# Patient Record
Sex: Female | Born: 1985 | Race: White | Hispanic: No | Marital: Single | Smoking: Current every day smoker
Health system: Southern US, Community
[De-identification: ages and names within clinical notes are randomized; demographics above are authoritative.]

## PROBLEM LIST (undated history)

## (undated) DIAGNOSIS — F4310 Post-traumatic stress disorder, unspecified: Secondary | ICD-10-CM

## (undated) DIAGNOSIS — F319 Bipolar disorder, unspecified: Secondary | ICD-10-CM

## (undated) DIAGNOSIS — E119 Type 2 diabetes mellitus without complications: Secondary | ICD-10-CM

## (undated) DIAGNOSIS — M5430 Sciatica, unspecified side: Secondary | ICD-10-CM

## (undated) DIAGNOSIS — M1990 Unspecified osteoarthritis, unspecified site: Secondary | ICD-10-CM

## (undated) HISTORY — PX: TONSILLECTOMY: SUR1361

## (undated) HISTORY — PX: KNEE SURGERY: SHX244

---

## 2012-01-24 ENCOUNTER — Encounter (HOSPITAL_COMMUNITY): Payer: Self-pay | Admitting: *Deleted

## 2012-01-24 ENCOUNTER — Emergency Department (HOSPITAL_COMMUNITY)
Admission: EM | Admit: 2012-01-24 | Discharge: 2012-01-24 | Disposition: A | Discharge status: Home / Self Care | Payer: Medicaid Other | Attending: Emergency Medicine | Admitting: Emergency Medicine

## 2012-01-24 DIAGNOSIS — M549 Dorsalgia, unspecified: Secondary | ICD-10-CM | POA: Insufficient documentation

## 2012-01-24 DIAGNOSIS — Z8739 Personal history of other diseases of the musculoskeletal system and connective tissue: Secondary | ICD-10-CM | POA: Insufficient documentation

## 2012-01-24 HISTORY — DX: Sciatica, unspecified side: M54.30

## 2012-01-24 HISTORY — DX: Unspecified osteoarthritis, unspecified site: M19.90

## 2012-01-24 MED ORDER — CYCLOBENZAPRINE HCL 10 MG PO TABS: 10.0000 mg | ORAL_TABLET | Freq: Two times a day (BID) | ORAL | Status: AC | PRN

## 2012-01-24 MED ORDER — OXYCODONE-ACETAMINOPHEN 5-325 MG PO TABS: 1.0000 | ORAL_TABLET | Freq: Four times a day (QID) | ORAL | Status: AC | PRN

## 2012-01-24 NOTE — ED Provider Notes (Signed)
History     CSN: [id number]  Arrival date & time 01/24/12  1458   First MD Initiated Contact with Patient 01/24/12 1643      Chief Complaint  Patient presents with  . Back Pain    (Consider location/radiation/quality/duration/timing/severity/associated sxs/prior treatment) HPI  Patient to the ED with complaints of back pain after lifting boxes while moving this morning. She has a history of having problems with sciatica in the past but it is not a problem she chronically deals with. She admits to trying Tylenol and taking a nap but her pains were still present, therefore she came to the ED. The patient denies having bowel or urinary incontinence. She is able to walk but with pain. She denies numbness and tingling to extremities. Denies IV drug use.   Past Medical History  Diagnosis Date  . Arthritis   . Sciatica     Past Surgical History  Procedure Date  . Cesarean section   . Knee surgery     No family history on file.  History  Substance Use Topics  . Smoking status: Never Smoker   . Smokeless tobacco: Not on file  . Alcohol Use: No    OB History    Grav Para Term Preterm Abortions TAB SAB Ect Mult Living                  Review of Systems   HEENT: denies blurry vision or change in hearing PULMONARY: Denies difficulty breathing and SOB CARDIAC: denies chest pain or heart palpitations MUSCULOSKELETAL:  denies being unable to ambulate ABDOMEN AL: denies abdominal pain GU: denies loss of bowel or urinary control NEURO: denies numbness and tingling in extremities   Allergies  Neosporin  Home Medications   Current Outpatient Rx  Name Route Sig Dispense Refill  . IBUPROFEN 200 MG PO TABS Oral Take 400 mg by mouth every 6 (six) hours as needed. For pain    . CYCLOBENZAPRINE HCL 10 MG PO TABS Oral Take 1 tablet (10 mg total) by mouth 2 (two) times daily as needed for muscle spasms. 20 tablet 0  . OXYCODONE-ACETAMINOPHEN 5-325 MG PO TABS Oral Take 1 tablet  by mouth every 6 (six) hours as needed for pain. 15 tablet 0    BP 146/104  Pulse 103  Temp(Src) 98.7 F (37.1 C) (Oral)  Resp 19  SpO2 100%  LMP 01/09/2012  Physical Exam  Nursing note and vitals reviewed. Constitutional: She appears well-developed and well-nourished. No distress.  HENT:  Head: Normocephalic and atraumatic.  Eyes: Pupils are equal, round, and reactive to light.  Neck: Normal range of motion. Neck supple.  Cardiovascular: Normal rate and regular rhythm.   Pulmonary/Chest: Effort normal.  Abdominal: Soft.  Musculoskeletal:       Lumbar back: She exhibits decreased range of motion, tenderness and pain. She exhibits no bony tenderness, no swelling, no edema, no deformity, no laceration, no spasm and normal pulse.       Back:        Equal strength to bilateral lower extremities. Neurosensory  function adequate to both legs. Skin color is normal. Skin is warm and moist. I see no step off deformity, no bony tenderness. Pt is able to ambulate without limp. Pain is relieved when sitting in certain positions. ROM is decreased due to pain. No crepitus, laceration, effusion, swelling.  Pulses are normal   Neurological: She is alert.  Skin: Skin is warm and dry.    ED Course  Procedures (  including critical care time)  Labs Reviewed - No data to display No results found.   1. Back pain       MDM  Patient with back pain. No neurological deficits. Patient is ambulatory. No warning symptoms of back pain including: loss of bowel or bladder control, night sweats, waking from sleep with back pain, unexplained fevers or weight loss, h/o cancer, IVDU, recent trauma. No concern for cauda equina, epidural abscess, or other serious cause of back pain. Conservative measures such as rest, ice/heat and pain medicine indicated with PCP follow-up if no improvement with conservative management.           Dorthula Matas, PA 01/24/12 1700

## 2012-01-24 NOTE — Discharge Instructions (Signed)
Back Exercises   Back exercises help treat and prevent back injuries. The goal of back exercises is to increase the strength of your abdominal and back muscles and the flexibility of your back. These exercises should be started when you no longer have back pain. Back exercises include:   Pelvic Tilt. Lie on your back with your knees bent. Tilt your pelvis until the lower part of your back is against the floor. Hold this position 5 to 10 sec and repeat 5 to 10 times.   Knee to Chest. Pull first 1 knee up against your chest and hold for 20 to 30 seconds, repeat this with the other knee, and then both knees. This may be done with the other leg straight or bent, whichever feels better.   Sit-Ups or Curl-Ups. Bend your knees 90 degrees. Start with tilting your pelvis, and do a partial, slow sit-up, lifting your trunk only 30 to 45 degrees off the floor. Take at least 2 to 3 seconds for each sit-up. Do not do sit-ups with your knees out straight. If partial sit-ups are difficult, simply do the above but with only tightening your abdominal muscles and holding it as directed.   Hip-Lift. Lie on your back with your knees flexed 90 degrees. Push down with your feet and shoulders as you raise your hips a couple inches off the floor; hold for 10 seconds, repeat 5 to 10 times.   Back arches. Lie on your stomach, propping yourself up on bent elbows. Slowly press on your hands, causing an arch in your low back. Repeat 3 to 5 times. Any initial stiffness and discomfort should lessen with repetition over time.   Shoulder-Lifts. Lie face down with arms beside your body. Keep hips and torso pressed to floor as you slowly lift your head and shoulders off the floor.   Do not overdo your exercises, especially in the beginning. Exercises may cause you some mild back discomfort which lasts for a few minutes; however, if the pain is more severe, or lasts for more than 15 minutes, do not continue exercises until you see your caregiver.  Improvement with exercise therapy for back problems is slow.   See your caregivers for assistance with developing a proper back exercise program.   Document Released: 10/02/2004 Document Revised: 08/14/2011 Document Reviewed: 08/25/2005   [employer]® Patient Information ©2012 [employer].     Back Pain, Adult   Low back pain is very common. About 1 in 5 people have back pain. The cause of low back pain is rarely dangerous. The pain often gets better over time. About half of people with a sudden onset of back pain feel better in just 2 weeks. About 8 in 10 people feel better by 6 weeks.   CAUSES   Some common causes of back pain include:   Strain of the muscles or ligaments supporting the spine.   Wear and tear (degeneration) of the spinal discs.   Arthritis.   Direct injury to the back.   DIAGNOSIS   Most of the time, the direct cause of low back pain is not known. However, back pain can be treated effectively even when the exact cause of the pain is unknown. Answering your caregiver's questions about your overall health and symptoms is one of the most accurate ways to make sure the cause of your pain is not dangerous. If your caregiver needs more information, he or she may order lab work or imaging tests (X-rays or MRIs). However, even   if imaging tests show changes in your back, this usually does not require surgery.   HOME CARE INSTRUCTIONS   For many people, back pain returns. Since low back pain is rarely dangerous, it is often a condition that people can learn to manage on their own.   Remain active. It is stressful on the back to sit or stand in one place. Do not sit, drive, or stand in one place for more than 30 minutes at a time. Take short walks on level surfaces as soon as pain allows. Try to increase the length of time you walk each day.   Do not stay in bed. Resting more than 1 or 2 days can delay your recovery.   Do not avoid exercise or work. Your body is made to move. It is not dangerous to be active,  even though your back may hurt. Your back will likely heal faster if you return to being active before your pain is gone.   Pay attention to your body when you bend and lift. Many people have less discomfort when lifting if they bend their knees, keep the load close to their bodies, and avoid twisting. Often, the most comfortable positions are those that put less stress on your recovering back.   Find a comfortable position to sleep. Use a firm mattress and lie on your side with your knees slightly bent. If you lie on your back, put a pillow under your knees.   Only take over-the-counter or prescription medicines as directed by your caregiver. Over-the-counter medicines to reduce pain and inflammation are often the most helpful. Your caregiver may prescribe muscle relaxant drugs. These medicines help dull your pain so you can more quickly return to your normal activities and healthy exercise.   Put ice on the injured area.   Put ice in a plastic bag.   Place a towel between your skin and the bag.   Leave the ice on for 15 to 20 minutes, 3 to 4 times a day for the first 2 to 3 days. After that, ice and heat may be alternated to reduce pain and spasms.   Ask your caregiver about trying back exercises and gentle massage. This may be of some benefit.   Avoid feeling anxious or stressed. Stress increases muscle tension and can worsen back pain. It is important to recognize when you are anxious or stressed and learn ways to manage it. Exercise is a great option.   SEEK MEDICAL CARE IF:   You have pain that is not relieved with rest or medicine.   You have pain that does not improve in 1 week.   You have new symptoms.   You are generally not feeling well.   SEEK IMMEDIATE MEDICAL CARE IF:   You have pain that radiates from your back into your legs.   You develop new bowel or bladder control problems.   You have unusual weakness or numbness in your arms or legs.   You develop nausea or vomiting.   You develop abdominal  pain.   You feel faint.   Document Released: 08/25/2005 Document Revised: 08/14/2011 Document Reviewed: 01/13/2011   [employer]® Patient Information ©2012 [employer].

## 2012-01-24 NOTE — ED Notes (Signed)
Pt reports moving heavy objects and reports heard a pop and noted mid and lower back pain on both sides with worse pain to right side.  Pt reports pain to right buttock.  Pt reports history of arthritis and sciatic nerve pain. Pt denies incontinence. Pt reports taking tylenol for pain with last dose this AM.  Pt reports pain is 7/10.

## 2012-01-25 NOTE — ED Provider Notes (Signed)
Medical screening examination/treatment/procedure(s) were performed by non-physician practitioner and as supervising physician I was immediately available for consultation/collaboration.  Doug Sou, MD 01/25/12 0005

## 2012-02-05 ENCOUNTER — Emergency Department (HOSPITAL_COMMUNITY)
Admission: EM | Admit: 2012-02-05 | Discharge: 2012-02-05 | Disposition: A | Discharge status: Home / Self Care | Payer: Medicaid Other | Attending: Emergency Medicine | Admitting: Emergency Medicine

## 2012-02-05 ENCOUNTER — Encounter (HOSPITAL_COMMUNITY): Payer: Self-pay

## 2012-02-05 DIAGNOSIS — M5430 Sciatica, unspecified side: Secondary | ICD-10-CM | POA: Insufficient documentation

## 2012-02-05 DIAGNOSIS — S39012A Strain of muscle, fascia and tendon of lower back, initial encounter: Secondary | ICD-10-CM

## 2012-02-05 DIAGNOSIS — M539 Dorsopathy, unspecified: Secondary | ICD-10-CM | POA: Insufficient documentation

## 2012-02-05 DIAGNOSIS — M5432 Sciatica, left side: Secondary | ICD-10-CM

## 2012-02-05 MED ORDER — NAPROXEN 500 MG PO TABS: 500.0000 mg | ORAL_TABLET | Freq: Two times a day (BID) | ORAL | Status: DC

## 2012-02-05 MED ORDER — CYCLOBENZAPRINE HCL 10 MG PO TABS: 10.0000 mg | ORAL_TABLET | Freq: Three times a day (TID) | ORAL | Status: AC | PRN

## 2012-02-05 MED ORDER — OXYCODONE-ACETAMINOPHEN 5-325 MG PO TABS: 1.0000 | ORAL_TABLET | ORAL | Status: AC | PRN

## 2012-02-05 NOTE — ED Notes (Signed)
Pt presents with low back pain after picking up her child.  Pt reports h/o arthritis in her back, reports a "pop" on L lower back with pain radiating down both legs.  Pt denies any urinary or fecal incontinence.

## 2012-02-05 NOTE — ED Provider Notes (Signed)
History  This chart was scribed for Sierra Booze, MD by Bennett Scrape. This patient was seen in room STRE7/STRE7 and the patient's care was started at 3:44PM.  CSN: [id number]  Arrival date & time 02/05/12  1422   First MD Initiated Contact with Patient 02/05/12 1544      Chief Complaint  Patient presents with  . Back Pain    The history is provided by the patient. No language interpreter was used.    Sierra Ramirez is a 26 y.o. female with a h/o arthritis and sciatica who presents to the Emergency Department complaining of low left-sided back pain described as a fiery sensation that raidates down both legs that started after picking up her child off a changing table. Movement worsens the pain. She denies taking OTC medications to improve her symptoms. Pt was seen over one week ago for same symptoms and given prescriptions which she states improved her symptoms. She also states that she is almost out of them which is why she is here. She reports that she was given a referral to an orthopedist which she has not been able to follow up with yet. She denies urinary or bowel incontinence. She denies smoking and alcohol use.  No PCP.  Past Medical History  Diagnosis Date  . Arthritis   . Sciatica     Past Surgical History  Procedure Date  . Cesarean section   . Knee surgery   . Tonsillectomy     No family history on file.  History  Substance Use Topics  . Smoking status: Never Smoker   . Smokeless tobacco: Not on file  . Alcohol Use: No     Review of Systems  Constitutional: Negative for fever and chills.  Respiratory: Negative for shortness of breath.   Gastrointestinal: Negative for nausea and vomiting.  Musculoskeletal: Positive for back pain.  Neurological: Negative for weakness.    Allergies  Neosporin  Home Medications  No current outpatient prescriptions on file.  Triage Vitals: BP 162/109  Pulse 87  Temp(Src) 98.4 F (36.9 C) (Oral)  Resp 18  Ht  5\' 4"  (1.626 m)  Wt 290 lb (131.543 kg)  BMI 49.78 kg/m2  SpO2 97%  LMP 01/09/2012  Physical Exam  Nursing note and vitals reviewed. Constitutional: She is oriented to person, place, and time. She appears well-developed and well-nourished. No distress.       Obsese, appears to be in pain  HENT:  Head: Normocephalic and atraumatic.  Eyes: EOM are normal.  Neck: Neck supple. No tracheal deviation present.  Cardiovascular: Normal rate.   Pulmonary/Chest: Effort normal. No respiratory distress.  Musculoskeletal: Normal range of motion. She exhibits tenderness.       Moderate tenderness to the lumbar spine and left para lumbar muscles with moderate to severe left para lumbar spasm, positive straight leg raise on left at 15 degrees, positive crossed straight leg raise on left at 30 degrees  Neurological: She is alert and oriented to person, place, and time.       Decreased pin prick sensation across lateral aspect of left foot  Skin: Skin is warm and dry.  Psychiatric: She has a normal mood and affect. Her behavior is normal.    ED Course  Procedures (including critical care time)  DIAGNOSTIC STUDIES: Oxygen Saturation is 97% on room air, adequate by my interpretation.    COORDINATION OF CARE: 3:50PM-Discussed discharge plan of naproxen and ice with pt and pt agreed. Advised pt to follow up with  the orthopedist referral.     1. Lumbar strain   2. Sciatica, left       MDM  Low back pain with significant component of spasm. Old records were reviewed and she does have a recent ED visit for back pain treated with oxycodone and cyclobenzaprine. She is sent home with prescriptions for naproxen, cyclobenzaprine, and oxycodone-acetaminophen. She is encouraged to make a followup appointment with the orthopedic physician she was referred to have her last visit.      I personally performed the services described in this documentation, which was scribed in my presence. The recorded  information has been reviewed and considered.      Sierra Booze, MD 02/06/12 1451

## 2012-02-05 NOTE — Discharge Instructions (Signed)
Back Pain, Adult Low back pain is very common. About 1 in 5 people have back pain.The cause of low back pain is rarely dangerous. The pain often gets better over time.About half of people with a sudden onset of back pain feel better in just 2 weeks. About 8 in 10 people feel better by 6 weeks.  CAUSES Some common causes of back pain include:  Strain of the muscles or ligaments supporting the spine.   Wear and tear (degeneration) of the spinal discs.   Arthritis.   Direct injury to the back.  DIAGNOSIS Most of the time, the direct cause of low back pain is not known.However, back pain can be treated effectively even when the exact cause of the pain is unknown.Answering your caregiver's questions about your overall health and symptoms is one of the most accurate ways to make sure the cause of your pain is not dangerous. If your caregiver needs more information, he or she may order lab work or imaging tests (X-rays or MRIs).However, even if imaging tests show changes in your back, this usually does not require surgery. HOME CARE INSTRUCTIONS For many people, back pain returns.Since low back pain is rarely dangerous, it is often a condition that people can learn to manageon their own.   Remain active. It is stressful on the back to sit or stand in one place. Do not sit, drive, or stand in one place for more than 30 minutes at a time. Take short walks on level surfaces as soon as pain allows.Try to increase the length of time you walk each day.   Do not stay in bed.Resting more than 1 or 2 days can delay your recovery.   Do not avoid exercise or work.Your body is made to move.It is not dangerous to be active, even though your back may hurt.Your back will likely heal faster if you return to being active before your pain is gone.   Pay attention to your body when you bend and lift. Many people have less discomfortwhen lifting if they bend their knees, keep the load close to their  bodies,and avoid twisting. Often, the most comfortable positions are those that put less stress on your recovering back.   Find a comfortable position to sleep. Use a firm mattress and lie on your side with your knees slightly bent. If you lie on your back, put a pillow under your knees.   Only take over-the-counter or prescription medicines as directed by your caregiver. Over-the-counter medicines to reduce pain and inflammation are often the most helpful.Your caregiver may prescribe muscle relaxant drugs.These medicines help dull your pain so you can more quickly return to your normal activities and healthy exercise.   Put ice on the injured area.   Put ice in a plastic bag.   Place a towel between your skin and the bag.   Leave the ice on for 15 to 20 minutes, 3 to 4 times a day for the first 2 to 3 days. After that, ice and heat may be alternated to reduce pain and spasms.   Ask your caregiver about trying back exercises and gentle massage. This may be of some benefit.   Avoid feeling anxious or stressed.Stress increases muscle tension and can worsen back pain.It is important to recognize when you are anxious or stressed and learn ways to manage it.Exercise is a great option.  SEEK MEDICAL CARE IF:  You have pain that is not relieved with rest or medicine.   You have   pain that does not improve in 1 week.   You have new symptoms.   You are generally not feeling well.  SEEK IMMEDIATE MEDICAL CARE IF:   You have pain that radiates from your back into your legs.   You develop new bowel or bladder control problems.   You have unusual weakness or numbness in your arms or legs.   You develop nausea or vomiting.   You develop abdominal pain.   You feel faint.  Document Released: 08/25/2005 Document Revised: 08/14/2011 Document Reviewed: 01/13/2011 [facility] Patient Information 2012 [place], [place].  Naproxen and naproxen sodium oral immediate-release tablets What is this  medicine? NAPROXEN (na PROX en) is a non-steroidal anti-inflammatory drug (NSAID). It is used to reduce swelling and to treat pain. This medicine may be used for dental pain, headache, or painful monthly periods. It is also used for painful joint and muscular problems such as arthritis, tendinitis, bursitis, and gout. This medicine may be used for other purposes; ask your health care provider or pharmacist if you have questions. What should I tell my health care provider before I take this medicine? They need to know if you have any of these conditions: -asthma -cigarette smoker -drink more than 3 alcohol containing drinks a day -heart disease or circulation problems such as heart failure or leg edema (fluid retention) -high blood pressure -kidney disease -liver disease -stomach bleeding or ulcers -an unusual or allergic reaction to naproxen, aspirin, other NSAIDs, other medicines, foods, dyes, or preservatives -pregnant or trying to get pregnant -breast-feeding How should I use this medicine? Take this medicine by mouth with a glass of water. Follow the directions on the prescription label. Take it with food if your stomach gets upset. Try to not lie down for at least 10 minutes after you take it. Take your medicine at regular intervals. Do not take your medicine more often than directed. Long-term, continuous use may increase the risk of heart attack or stroke. A special MedGuide will be given to you by the pharmacist with each prescription and refill. Be sure to read this information carefully each time. Talk to your pediatrician regarding the use of this medicine in children. Special care may be needed. Overdosage: If you think you have taken too much of this medicine contact a poison control center or emergency room at once. NOTE: This medicine is only for you. Do not share this medicine with others. What if I miss a dose? If you miss a dose, take it as soon as you can. If it is almost  time for your next dose, take only that dose. Do not take double or extra doses. What may interact with this medicine? -alcohol -aspirin -cidofovir -diuretics -lithium -methotrexate -other drugs for inflammation like ketorolac or prednisone -pemetrexed -probenecid -warfarin This list may not describe all possible interactions. Give your health care provider a list of all the medicines, herbs, non-prescription drugs, or dietary supplements you use. Also tell them if you smoke, drink alcohol, or use illegal drugs. Some items may interact with your medicine. What should I watch for while using this medicine? Tell your doctor or health care professional if your pain does not get better. Talk to your doctor before taking another medicine for pain. Do not treat yourself. This medicine does not prevent heart attack or stroke. In fact, this medicine may increase the chance of a heart attack or stroke. The chance may increase with longer use of this medicine and in people who have heart disease.  If you take aspirin to prevent heart attack or stroke, talk with your doctor or health care professional. Do not take other medicines that contain aspirin, ibuprofen, or naproxen with this medicine. Side effects such as stomach upset, nausea, or ulcers may be more likely to occur. Many medicines available without a prescription should not be taken with this medicine. This medicine can cause ulcers and bleeding in the stomach and intestines at any time during treatment. Do not smoke cigarettes or drink alcohol. These increase irritation to your stomach and can make it more susceptible to damage from this medicine. Ulcers and bleeding can happen without warning symptoms and can cause death. You may get drowsy or dizzy. Do not drive, use machinery, or do anything that needs mental alertness until you know how this medicine affects you. Do not stand or sit up quickly, especially if you are an older patient. This reduces  the risk of dizzy or fainting spells. This medicine can cause you to bleed more easily. Try to avoid damage to your teeth and gums when you brush or floss your teeth. What side effects may I notice from receiving this medicine? Side effects that you should report to your doctor or health care professional as soon as possible: -black or bloody stools, blood in the urine or vomit -blurred vision -chest pain -difficulty breathing or wheezing -nausea or vomiting -severe stomach pain -skin rash, skin redness, blistering or peeling skin, hives, or itching -slurred speech or weakness on one side of the body -swelling of eyelids, throat, lips -unexplained weight gain or swelling -unusually weak or tired -yellowing of eyes or skin Side effects that usually do not require medical attention (report to your doctor or health care professional if they continue or are bothersome): -constipation -headache -heartburn This list may not describe all possible side effects. Call your doctor for medical advice about side effects. You may report side effects to FDA at 1-800-FDA-1088. Where should I keep my medicine? Keep out of the reach of children. Store at room temperature between 15 and 30 degrees C (59 and 86 degrees F). Keep container tightly closed. Throw away any unused medicine after the expiration date. NOTE: This sheet is a summary. It may not cover all possible information. If you have questions about this medicine, talk to your doctor, pharmacist, or health care provider.  2012, Elsevier/[employer] Standard. (08/27/2009 8:10:16 PM)  Cyclobenzaprine tablets What is this medicine? CYCLOBENZAPRINE (sye kloe BEN za preen) is a muscle relaxer. It is used to treat muscle pain, spasms, and stiffness. This medicine may be used for other purposes; ask your health care provider or pharmacist if you have questions. What should I tell my health care provider before I take this medicine? They need to know if you  have any of these conditions: -heart disease, irregular heartbeat, or previous heart attack -liver disease -thyroid problem -an unusual or allergic reaction to cyclobenzaprine, tricyclic antidepressants, lactose, other medicines, foods, dyes, or preservatives -pregnant or trying to get pregnant -breast-feeding How should I use this medicine? Take this medicine by mouth with a glass of water. Follow the directions on the prescription label. If this medicine upsets your stomach, take it with food or milk. Take your medicine at regular intervals. Do not take it more often than directed. Talk to your pediatrician regarding the use of this medicine in children. Special care may be needed. Overdosage: If you think you have taken too much of this medicine contact a poison control center or emergency  room at once. NOTE: This medicine is only for you. Do not share this medicine with others. What if I miss a dose? If you miss a dose, take it as soon as you can. If it is almost time for your next dose, take only that dose. Do not take double or extra doses. What may interact with this medicine? Do not take this medicine with any of the following medications: -cisapride -droperidol -flecainide -grepafloxacin -halofantrine -levomethadyl -MAOIs like Carbex, Eldepryl, Marplan, Nardil, and Parnate -nilotinib -pimozide -probucol -sertindole This medicine may also interact with the following medications: -abarelix -alcohol -contrast dyes -dolasetron -guanethidine -medicines for cancer -medicines for depression, anxiety, or psychotic disturbances -medicines to treat an irregular heartbeat -medicines used for sleep or numbness during surgery or procedure -methadone -octreotide -ondansetron -palonosetron -phenothiazines like chlorpromazine, mesoridazine, prochlorperazine, thioridazine -some medicines for infection like alfuzosin, chloroquine, clarithromycin, levofloxacin, mefloquine,  pentamidine, troleandomycin -tramadol -vardenafil This list may not describe all possible interactions. Give your health care provider a list of all the medicines, herbs, non-prescription drugs, or dietary supplements you use. Also tell them if you smoke, drink alcohol, or use illegal drugs. Some items may interact with your medicine. What should I watch for while using this medicine? Check with your doctor or health care professional if your condition does not improve within 1 to 3 weeks. You may get drowsy or dizzy when you first start taking the medicine or change doses. Do not drive, use machinery, or do anything that may be dangerous until you know how the medicine affects you. Stand or sit up slowly. Your mouth may get dry. Drinking water, chewing sugarless gum, or sucking on hard candy may help. What side effects may I notice from receiving this medicine? Side effects that you should report to your doctor or health care professional as soon as possible: -allergic reactions like skin rash, itching or hives, swelling of the face, lips, or tongue -chest pain -fast heartbeat -hallucinations -seizures -vomiting Side effects that usually do not require medical attention (report to your doctor or health care professional if they continue or are bothersome): -headache This list may not describe all possible side effects. Call your doctor for medical advice about side effects. You may report side effects to FDA at 1-800-FDA-1088. Where should I keep my medicine? Keep out of the reach of children. Store at room temperature between 15 and 30 degrees C (59 and 86 degrees F). Keep container tightly closed. Throw away any unused medicine after the expiration date. NOTE: This sheet is a summary. It may not cover all possible information. If you have questions about this medicine, talk to your doctor, pharmacist, or health care provider.  2012, [employer]vier/Gold Standard. (12/06/2007 10:26:21  PM)  Acetaminophen; Oxycodone tablets What is this medicine? ACETAMINOPHEN; OXYCODONE (a set a MEE noe fen; ox i KOE done) is a pain reliever. It is used to treat mild to moderate pain. This medicine may be used for other purposes; ask your health care provider or pharmacist if you have questions. What should I tell my health care provider before I take this medicine? They need to know if you have any of these conditions: -brain tumor -Crohn's disease, inflammatory bowel disease, or ulcerative colitis -drink more than 3 alcohol containing drinks per day -drug abuse or addiction -head injury -heart or circulation problems -kidney disease or problems going to the bathroom -liver disease -lung disease, asthma, or breathing problems -an unusual or allergic reaction to acetaminophen, oxycodone, other opioid analgesics, other medicines,  foods, dyes, or preservatives -pregnant or trying to get pregnant -breast-feeding How should I use this medicine? Take this medicine by mouth with a full glass of water. Follow the directions on the prescription label. Take your medicine at regular intervals. Do not take your medicine more often than directed. Talk to your pediatrician regarding the use of this medicine in children. Special care may be needed. Patients over 34 years old may have a stronger reaction and need a smaller dose. Overdosage: If you think you have taken too much of this medicine contact a poison control center or emergency room at once. NOTE: This medicine is only for you. Do not share this medicine with others. What if I miss a dose? If you miss a dose, take it as soon as you can. If it is almost time for your next dose, take only that dose. Do not take double or extra doses. What may interact with this medicine? -alcohol or medicines that contain alcohol -antihistamines -barbiturates like amobarbital, butalbital, butabarbital, methohexital, pentobarbital, phenobarbital, thiopental,  and secobarbital -benztropine -drugs for bladder problems like solifenacin, trospium, oxybutynin, tolterodine, hyoscyamine, and methscopolamine -drugs for breathing problems like ipratropium and tiotropium -drugs for certain stomach or intestine problems like propantheline, homatropine methylbromide, glycopyrrolate, atropine, belladonna, and dicyclomine -general anesthetics like etomidate, ketamine, nitrous oxide, propofol, desflurane, enflurane, halothane, isoflurane, and sevoflurane -medicines for depression, anxiety, or psychotic disturbances -medicines for pain like codeine, morphine, pentazocine, buprenorphine, butorphanol, nalbuphine, tramadol, and propoxyphene -medicines for sleep -muscle relaxants -naltrexone -phenothiazines like perphenazine, thioridazine, chlorpromazine, mesoridazine, fluphenazine, prochlorperazine, promazine, and trifluoperazine -scopolamine -trihexyphenidyl This list may not describe all possible interactions. Give your health care provider a list of all the medicines, herbs, non-prescription drugs, or dietary supplements you use. Also tell them if you smoke, drink alcohol, or use illegal drugs. Some items may interact with your medicine. What should I watch for while using this medicine? Tell your doctor or health care professional if your pain does not go away, if it gets worse, or if you have new or a different type of pain. You may develop tolerance to the medicine. Tolerance means that you will need a higher dose of the medication for pain relief. Tolerance is normal and is expected if you take this medicine for a long time. Do not suddenly stop taking your medicine because you may develop a severe reaction. Your body becomes used to the medicine. This does NOT mean you are addicted. Addiction is a behavior related to getting and using a drug for a nonmedical reason. If you have pain, you have a medical reason to take pain medicine. Your doctor will tell you how  much medicine to take. If your doctor wants you to stop the medicine, the dose will be slowly lowered over time to avoid any side effects. You may get drowsy or dizzy. Do not drive, use machinery, or do anything that needs mental alertness until you know how this medicine affects you. Do not stand or sit up quickly, especially if you are an older patient. This reduces the risk of dizzy or fainting spells. Alcohol may interfere with the effect of this medicine. Avoid alcoholic drinks. The medicine will cause constipation. Try to have a bowel movement at least every 2 to 3 days. If you do not have a bowel movement for 3 days, call your doctor or health care professional. Do not take Tylenol (acetaminophen) or medicines that have acetaminophen with this medicine. Too much acetaminophen can be very dangerous. Many nonprescription  medicines contain acetaminophen. Always read the labels carefully to avoid taking more acetaminophen. What side effects may I notice from receiving this medicine? Side effects that you should report to your doctor or health care professional as soon as possible: -allergic reactions like skin rash, itching or hives, swelling of the face, lips, or tongue -breathing difficulties, wheezing -confusion -light headedness or fainting spells -severe stomach pain -yellowing of the skin or the whites of the eyes Side effects that usually do not require medical attention (report to your doctor or health care professional if they continue or are bothersome): -dizziness -drowsiness -nausea -vomiting This list may not describe all possible side effects. Call your doctor for medical advice about side effects. You may report side effects to FDA at 1-800-FDA-1088. Where should I keep my medicine? Keep out of the reach of children. This medicine can be abused. Keep your medicine in a safe place to protect it from theft. Do not share this medicine with anyone. Selling or giving away this  medicine is dangerous and against the law. Store at room temperature between 20 and 25 degrees C (68 and 77 degrees F). Keep container tightly closed. Protect from light. Flush any unused medicines down the toilet. Do not use the medicine after the expiration date. NOTE: This sheet is a summary. It may not cover all possible information. If you have questions about this medicine, talk to your doctor, pharmacist, or health care provider.  2012, Elsevier/Gold Standard. (07/24/2008 10:01:21 AM)

## 2012-04-09 ENCOUNTER — Encounter (HOSPITAL_COMMUNITY): Payer: Self-pay | Admitting: *Deleted

## 2012-04-09 ENCOUNTER — Emergency Department (HOSPITAL_COMMUNITY)
Admission: EM | Admit: 2012-04-09 | Discharge: 2012-04-09 | Disposition: A | Discharge status: Home / Self Care | Payer: Medicaid Other | Attending: Emergency Medicine | Admitting: Emergency Medicine

## 2012-04-09 DIAGNOSIS — IMO0002 Reserved for concepts with insufficient information to code with codable children: Secondary | ICD-10-CM | POA: Insufficient documentation

## 2012-04-09 DIAGNOSIS — M129 Arthropathy, unspecified: Secondary | ICD-10-CM | POA: Insufficient documentation

## 2012-04-09 DIAGNOSIS — M5416 Radiculopathy, lumbar region: Secondary | ICD-10-CM

## 2012-04-09 MED ORDER — HYDROCODONE-ACETAMINOPHEN 5-325 MG PO TABS: 2.0000 | ORAL_TABLET | ORAL | Status: AC | PRN

## 2012-04-09 NOTE — ED Provider Notes (Signed)
History     CSN: [id number]  Arrival date & time 04/09/12  1411   None     Chief Complaint  Patient presents with  . Back Pain    (Consider location/radiation/quality/duration/timing/severity/associated sxs/prior treatment) HPI Complaint of low back pain radiating to both thighs onset 12 noon today suddenly when she flexes at the waist to pick something up at home pain was mild at first, has become worse over the past few hours. Treated with Tylenol and ibuprofen without relief. No other complaints no loss of bladder or bowel control  no other complaint. No other associated symptoms pain is improved with one on her left side worse with bending at the waist.Past Medical History  Diagnosis Date  . Arthritis   . Sciatica     Past Surgical History  Procedure Date  . Cesarean section   . Knee surgery   . Tonsillectomy     No family history on file.  History  Substance Use Topics  . Smoking status: Never Smoker   . Smokeless tobacco: Not on file  . Alcohol Use: No    OB History    Grav Para Term Preterm Abortions TAB SAB Ect Mult Living                  Review of Systems  Constitutional: Negative.   HENT: Negative.   Respiratory: Negative.   Cardiovascular: Negative.   Gastrointestinal: Negative.   Musculoskeletal: Positive for back pain.  Skin: Negative.   Neurological: Negative.   Hematological: Negative.   Psychiatric/Behavioral: Negative.   All other systems reviewed and are negative.    Allergies  Neosporin  Home Medications  No current outpatient prescriptions on file.  BP 149/93  Pulse 101  Temp 98.8 F (37.1 C) (Oral)  Resp 18  Ht 5\' 3"  (1.6 m)  Wt 277 lb (125.646 kg)  BMI 49.07 kg/m2  SpO2 98%  LMP 04/09/2012  Physical Exam  Nursing note and vitals reviewed. Constitutional: She appears well-developed and well-nourished.  HENT:  Head: Normocephalic and atraumatic.  Eyes: Conjunctivae are normal. Pupils are equal, round, and reactive to  light.  Neck: Neck supple. No tracheal deviation present. No thyromegaly present.  Cardiovascular: Normal rate and regular rhythm.   No murmur heard. Pulmonary/Chest: Effort normal and breath sounds normal.  Abdominal: Soft. Bowel sounds are normal. She exhibits no distension. There is no tenderness.       Morbidly obese  Musculoskeletal: Normal range of motion. She exhibits no edema and no tenderness.       Entire spine nontender  Neurological: She is alert. She has normal reflexes. Coordination normal.       Gait normal  Skin: Skin is warm and dry. No rash noted.  Psychiatric: She has a normal mood and affect. Her behavior is normal.    ED Course  Procedures (including critical care time)  Labs Reviewed - No data to display No results found.   No diagnosis found.    MDM  Plan prescription Norco. Patient driving home referral resource guide to get a primary care doctor Diagnosis lumbar radiculopathy      Plus  Doug Sou, MD 04/09/12 [phone]

## 2012-04-09 NOTE — ED Notes (Signed)
Pt state she started having back pain today at 12pm. Pt states she was cleaning and hear a pop in her back. Pt states she is lower back pain.

## 2012-07-22 ENCOUNTER — Emergency Department (HOSPITAL_COMMUNITY)
Admission: EM | Admit: 2012-07-22 | Discharge: 2012-07-22 | Disposition: A | Discharge status: Home / Self Care | Payer: Medicaid Other | Attending: Emergency Medicine | Admitting: Emergency Medicine

## 2012-07-22 ENCOUNTER — Emergency Department (HOSPITAL_COMMUNITY): Payer: Medicaid Other

## 2012-07-22 ENCOUNTER — Encounter (HOSPITAL_COMMUNITY): Payer: Self-pay | Admitting: Emergency Medicine

## 2012-07-22 DIAGNOSIS — R509 Fever, unspecified: Secondary | ICD-10-CM | POA: Insufficient documentation

## 2012-07-22 DIAGNOSIS — J40 Bronchitis, not specified as acute or chronic: Secondary | ICD-10-CM | POA: Insufficient documentation

## 2012-07-22 DIAGNOSIS — R51 Headache: Secondary | ICD-10-CM | POA: Insufficient documentation

## 2012-07-22 DIAGNOSIS — Z8739 Personal history of other diseases of the musculoskeletal system and connective tissue: Secondary | ICD-10-CM | POA: Insufficient documentation

## 2012-07-22 DIAGNOSIS — R0602 Shortness of breath: Secondary | ICD-10-CM | POA: Insufficient documentation

## 2012-07-22 LAB — D-DIMER, QUANTITATIVE: D-Dimer, Quant: <0.27 ug/mL-FEU (ref 0.00–0.48)

## 2012-07-22 LAB — CBC WITH DIFFERENTIAL/PLATELET
Basophils Absolute: 0.1 10*3/uL (ref 0.0–0.1)
Basophils Relative: 0 % (ref 0–1)
Eosinophils Absolute: 0.5 10*3/uL (ref 0.0–0.7)
HCT: 34 % — ABNORMAL LOW (ref 36.0–46.0)
Hemoglobin: 11.7 g/dL — ABNORMAL LOW (ref 12.0–15.0)
MCH: 30.2 pg (ref 26.0–34.0)
MCHC: 34.4 g/dL (ref 30.0–36.0)
Monocytes Absolute: 1.5 10*3/uL — ABNORMAL HIGH (ref 0.1–1.0)
Monocytes Relative: 9 % (ref 3–12)
Neutro Abs: 9.7 10*3/uL — ABNORMAL HIGH (ref 1.7–7.7)
Neutrophils Relative %: 60 % (ref 43–77)
RDW: 12.6 % (ref 11.5–15.5)

## 2012-07-22 LAB — POCT I-STAT, CHEM 8
Calcium, Ion: 1.21 mmol/L (ref 1.12–1.23)
Chloride: 109 mEq/L (ref 96–112)
Glucose, Bld: 102 mg/dL — ABNORMAL HIGH (ref 70–99)
HCT: 32 % — ABNORMAL LOW (ref 36.0–46.0)
Hemoglobin: 10.9 g/dL — ABNORMAL LOW (ref 12.0–15.0)

## 2012-07-22 MED ORDER — ALBUTEROL SULFATE HFA 108 (90 BASE) MCG/ACT IN AERS: 1.0000 | INHALATION_SPRAY | Freq: Four times a day (QID) | RESPIRATORY_TRACT | Status: AC | PRN

## 2012-07-22 MED ORDER — ACETAMINOPHEN-CODEINE #3 300-30 MG PO TABS: 1.0000 | ORAL_TABLET | Freq: Four times a day (QID) | ORAL | Status: DC | PRN

## 2012-07-22 MED ORDER — HYDROCODONE-ACETAMINOPHEN 5-325 MG PO TABS: 1.0000 | ORAL_TABLET | Freq: Four times a day (QID) | ORAL | Status: DC | PRN

## 2012-07-22 MED ORDER — BENZONATATE 100 MG PO CAPS
100.0000 mg | ORAL_CAPSULE | Freq: Once | ORAL | Status: AC
  Administered 2012-07-22: 100 mg via ORAL
  Filled 2012-07-22: qty 1

## 2012-07-22 MED ORDER — KETOROLAC TROMETHAMINE 30 MG/ML IJ SOLN
30.0000 mg | Freq: Once | INTRAMUSCULAR | Status: AC
Start: 2012-07-22 — End: 2012-07-22
  Administered 2012-07-22: 30 mg via INTRAVENOUS
  Filled 2012-07-22: qty 1

## 2012-07-22 MED ORDER — SODIUM CHLORIDE 0.9 % IV BOLUS (SEPSIS)
1000.0000 mL | Freq: Once | INTRAVENOUS | Status: AC
  Administered 2012-07-22: 1000 mL via INTRAVENOUS

## 2012-07-22 MED ORDER — ALBUTEROL SULFATE HFA 108 (90 BASE) MCG/ACT IN AERS: 1.0000 | INHALATION_SPRAY | Freq: Four times a day (QID) | RESPIRATORY_TRACT | Status: DC | PRN

## 2012-07-22 NOTE — ED Provider Notes (Signed)
Patient remains tachycardic despite two liters of fluid.  Temp < 100.  Lungs CTA bilaterally, persistent cough.  CXR without indication of pneumonia--mild bronchitic changes noted.  Discussed with Dr. Mick Sell check d-dimer to evaluate for pulmonary embolism in light of exertional dyspnea with persistent tachycardia.  11:22 PM D-dimer negative.  Will discharge home with prescriptions for vicodin and albuterol MDI.  Patient to follow-up with her PCP in [place].  Jimmye Norman, NP 07/22/12 [phone]

## 2012-07-22 NOTE — ED Provider Notes (Signed)
History  Scribed for Sierra Munch, MD, the patient was seen in room TR04C/TR04C. This chart was scribed by Sierra Ramirez. The patient's care started at 6:21 PM   CSN: [id number]  Arrival date & time 07/22/12  1756   First MD Initiated Contact with Patient 07/22/12 1817      Chief Complaint  Patient presents with  . Cough     The history is provided by the patient. No language interpreter was used.   Sierra Ramirez is a 26 y.o. female who presents to the Emergency Department complaining of cough and SOB that started six days ago.  Pt is also experiencing a headache that is worse with coughing.  She has had a fever and body aches.  Pt has used Vicks with little relief.  She denies any ill contacts.    Past Medical History  Diagnosis Date  . Arthritis   . Sciatica     Past Surgical History  Procedure Date  . Cesarean section   . Knee surgery   . Tonsillectomy     History reviewed. No pertinent family history.  History  Substance Use Topics  . Smoking status: Never Smoker   . Smokeless tobacco: Not on file  . Alcohol Use: No    OB History    Grav Para Term Preterm Abortions TAB SAB Ect Mult Living                  Review of Systems  Constitutional: Positive for fever.       Per HPI, otherwise negative  HENT:       Per HPI, otherwise negative  Eyes: Negative.   Respiratory: Positive for cough and shortness of breath.        Per HPI, otherwise negative  Cardiovascular:       Per HPI, otherwise negative  Gastrointestinal: Negative for nausea and vomiting.  Genitourinary: Negative.   Musculoskeletal:       Per HPI, otherwise negative  Skin: Negative.   Neurological: Positive for headaches. Negative for syncope.  All other systems reviewed and are negative.    Allergies  Neosporin  Home Medications  No current outpatient prescriptions on file.  BP 151/111  Pulse 118  Temp 99 F (37.2 C) (Oral)  Resp 18  SpO2 97%  LMP  07/02/2012  Physical Exam  Nursing note and vitals reviewed. Constitutional: She is oriented to person, place, and time. She appears well-developed and well-nourished. No distress.  HENT:  Head: Normocephalic and atraumatic.  Mouth/Throat: Oropharynx is clear and moist.  Eyes: EOM are normal.  Neck: Neck supple. No tracheal deviation present.  Cardiovascular:       Tachycardic   Pulmonary/Chest: Effort normal. No respiratory distress. She has no wheezes. She has no rales.  Musculoskeletal: Normal range of motion.  Neurological: She is alert and oriented to person, place, and time.  Skin: Skin is warm and dry.  Psychiatric: She has a normal mood and affect. Her behavior is normal.    ED Course  Procedures  DIAGNOSTIC STUDIES: Oxygen Saturation is 97% on room air, normal by my interpretation.    COORDINATION OF CARE:  18:27 Ordered: DG Chest 2 View   Labs Reviewed - No data to display No results found.   No diagnosis found.    MDM  I personally performed the services described in this documentation, which was scribed in my presence. The recorded information has been reviewed and is accurate.  This previously well female presents with new  cough, generalized discomfort.  On exam she is tachycardic, uncomfortable appearing.  Patient's oropharynx is clear.  She is in no distress.  Given the tachycardia, or some suspicion for acute viral upper respiratory infection.  Absent risk factors she is not candidate for a period influenza treatment.  Given her vital sign abnormalities, she received IV fluids, analgesics.  She was transferred to the CDU for the completion of her care.    Sierra Munch, MD 07/22/12 [phone]

## 2012-07-22 NOTE — ED Notes (Signed)
Pt wanting to know why she needs more blood drawn.  Explained to pt that we need to make sure she doesn't have a blood clot and if that test is negative she'll go home.

## 2012-07-22 NOTE — ED Notes (Signed)
Lab in with pt 

## 2012-07-22 NOTE — ED Notes (Signed)
Patient currently in xray. Family at bedside. Report received by Sedonia Small, RN.

## 2012-07-22 NOTE — ED Notes (Signed)
Pt reports cough since Friday, reports having coughing spells, feeling fatigue, denies sore throat

## 2012-07-24 NOTE — ED Provider Notes (Signed)
Medical screening examination/treatment/procedure(s) were performed by non-physician practitioner and as supervising physician I was immediately available for consultation/collaboration.  John-Adam Nadene Witherspoon, M.D.     John-Adam Jontae Adebayo, MD 07/24/12 1416 

## 2014-09-21 ENCOUNTER — Emergency Department (HOSPITAL_COMMUNITY)
Admission: EM | Admit: 2014-09-21 | Discharge: 2014-09-22 | Disposition: A | Discharge status: Home / Self Care | Payer: Medicaid Other | Attending: Emergency Medicine | Admitting: Emergency Medicine

## 2014-09-21 ENCOUNTER — Encounter (HOSPITAL_COMMUNITY): Payer: Self-pay | Admitting: Nurse Practitioner

## 2014-09-21 DIAGNOSIS — Z3202 Encounter for pregnancy test, result negative: Secondary | ICD-10-CM | POA: Insufficient documentation

## 2014-09-21 DIAGNOSIS — F4310 Post-traumatic stress disorder, unspecified: Secondary | ICD-10-CM | POA: Diagnosis not present

## 2014-09-21 DIAGNOSIS — R51 Headache: Secondary | ICD-10-CM | POA: Diagnosis not present

## 2014-09-21 DIAGNOSIS — F3130 Bipolar disorder, current episode depressed, mild or moderate severity, unspecified: Secondary | ICD-10-CM | POA: Insufficient documentation

## 2014-09-21 DIAGNOSIS — M545 Low back pain: Secondary | ICD-10-CM | POA: Diagnosis not present

## 2014-09-21 DIAGNOSIS — Z79899 Other long term (current) drug therapy: Secondary | ICD-10-CM | POA: Diagnosis not present

## 2014-09-21 DIAGNOSIS — R1084 Generalized abdominal pain: Secondary | ICD-10-CM | POA: Insufficient documentation

## 2014-09-21 DIAGNOSIS — R1030 Lower abdominal pain, unspecified: Secondary | ICD-10-CM | POA: Diagnosis present

## 2014-09-21 DIAGNOSIS — R519 Headache, unspecified: Secondary | ICD-10-CM

## 2014-09-21 DIAGNOSIS — M542 Cervicalgia: Secondary | ICD-10-CM | POA: Diagnosis not present

## 2014-09-21 DIAGNOSIS — E119 Type 2 diabetes mellitus without complications: Secondary | ICD-10-CM | POA: Insufficient documentation

## 2014-09-21 DIAGNOSIS — M549 Dorsalgia, unspecified: Secondary | ICD-10-CM

## 2014-09-21 HISTORY — DX: Post-traumatic stress disorder, unspecified: F43.10

## 2014-09-21 HISTORY — DX: Bipolar disorder, unspecified: F31.9

## 2014-09-21 HISTORY — DX: Type 2 diabetes mellitus without complications: E11.9

## 2014-09-21 LAB — POC URINE PREG, ED: PREG TEST UR: NEGATIVE

## 2014-09-21 MED ORDER — ALBUTEROL SULFATE (2.5 MG/3ML) 0.083% IN NEBU: 5.0000 mg | INHALATION_SOLUTION | Freq: Once | RESPIRATORY_TRACT | Status: DC

## 2014-09-21 MED ORDER — IPRATROPIUM BROMIDE 0.02 % IN SOLN: 0.5000 mg | Freq: Once | RESPIRATORY_TRACT | Status: DC

## 2014-09-21 MED ORDER — DIPHENHYDRAMINE HCL 50 MG/ML IJ SOLN
25.0000 mg | Freq: Once | INTRAMUSCULAR | Status: DC
  Filled 2014-09-21: qty 1

## 2014-09-21 MED ORDER — KETOROLAC TROMETHAMINE 30 MG/ML IJ SOLN
30.0000 mg | Freq: Once | INTRAMUSCULAR | Status: DC
Start: 2014-09-21 — End: 2014-09-22
  Filled 2014-09-21: qty 1

## 2014-09-21 MED ORDER — METOCLOPRAMIDE HCL 5 MG/ML IJ SOLN
10.0000 mg | Freq: Once | INTRAMUSCULAR | Status: DC
  Filled 2014-09-21: qty 2
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
## 2014-09-21 NOTE — ED Notes (Signed)
x2 unsuccessful IV attempts

## 2014-09-22 LAB — URINALYSIS, ROUTINE W REFLEX MICROSCOPIC
BILIRUBIN URINE: NEGATIVE
GLUCOSE, UA: NEGATIVE mg/dL
KETONES UR: 15 mg/dL — AB
LEUKOCYTES UA: NEGATIVE
Nitrite: NEGATIVE
Protein, ur: NEGATIVE mg/dL
SPECIFIC GRAVITY, URINE: 1.01 (ref 1.005–1.030)
Urobilinogen, UA: 0.2 mg/dL (ref 0.0–1.0)
pH: 6 (ref 5.0–8.0)

## 2014-09-22 LAB — URINE MICROSCOPIC-ADD ON

## 2014-09-22 MED ORDER — OXYCODONE-ACETAMINOPHEN 5-325 MG PO TABS
2.0000 | ORAL_TABLET | Freq: Once | ORAL | Status: AC
  Administered 2014-09-22: 2 via ORAL
  Filled 2014-09-22: qty 2

## 2014-09-22 MED ORDER — HYDROCODONE-ACETAMINOPHEN 5-325 MG PO TABS: 1.0000 | ORAL_TABLET | ORAL | Status: AC | PRN

## 2014-09-22 MED ORDER — NAPROXEN 500 MG PO TABS: 500.0000 mg | ORAL_TABLET | Freq: Two times a day (BID) | ORAL | Status: AC

## 2014-09-22 MED ORDER — KETOROLAC TROMETHAMINE 30 MG/ML IJ SOLN
30.0000 mg | Freq: Once | INTRAMUSCULAR | Status: AC
  Administered 2014-09-22: 30 mg via INTRAMUSCULAR
  Filled 2014-09-22: qty 1

## 2014-09-22 MED ORDER — DIPHENHYDRAMINE HCL 25 MG PO CAPS
25.0000 mg | ORAL_CAPSULE | Freq: Once | ORAL | Status: AC
  Administered 2014-09-22: 25 mg via ORAL
  Filled 2014-09-22: qty 1

## 2018-02-04 ENCOUNTER — Emergency Department (HOSPITAL_COMMUNITY)
Admission: EM | Admit: 2018-02-04 | Discharge: 2018-02-04 | Disposition: A | Discharge status: Home / Self Care | Payer: No Typology Code available for payment source | Attending: Emergency Medicine | Admitting: Emergency Medicine

## 2018-02-04 ENCOUNTER — Emergency Department (HOSPITAL_COMMUNITY): Payer: No Typology Code available for payment source

## 2018-02-04 ENCOUNTER — Other Ambulatory Visit: Payer: Self-pay

## 2018-02-04 ENCOUNTER — Encounter (HOSPITAL_COMMUNITY): Payer: Self-pay

## 2018-02-04 DIAGNOSIS — E119 Type 2 diabetes mellitus without complications: Secondary | ICD-10-CM | POA: Diagnosis not present

## 2018-02-04 DIAGNOSIS — Y939 Activity, unspecified: Secondary | ICD-10-CM | POA: Diagnosis not present

## 2018-02-04 DIAGNOSIS — Y929 Unspecified place or not applicable: Secondary | ICD-10-CM | POA: Insufficient documentation

## 2018-02-04 DIAGNOSIS — S62650A Nondisplaced fracture of medial phalanx of right index finger, initial encounter for closed fracture: Secondary | ICD-10-CM | POA: Insufficient documentation

## 2018-02-04 DIAGNOSIS — Y999 Unspecified external cause status: Secondary | ICD-10-CM | POA: Insufficient documentation

## 2018-02-04 DIAGNOSIS — M7918 Myalgia, other site: Secondary | ICD-10-CM

## 2018-02-04 DIAGNOSIS — R51 Headache: Secondary | ICD-10-CM | POA: Insufficient documentation

## 2018-02-04 DIAGNOSIS — Z79899 Other long term (current) drug therapy: Secondary | ICD-10-CM | POA: Diagnosis not present

## 2018-02-04 DIAGNOSIS — M542 Cervicalgia: Secondary | ICD-10-CM | POA: Diagnosis not present

## 2018-02-04 DIAGNOSIS — F17210 Nicotine dependence, cigarettes, uncomplicated: Secondary | ICD-10-CM | POA: Insufficient documentation

## 2018-02-04 MED ORDER — TRAMADOL HCL 50 MG PO TABS: 50.0000 mg | ORAL_TABLET | Freq: Four times a day (QID) | ORAL | 0 refills | Status: AC | PRN

## 2018-02-04 MED ORDER — KETOROLAC TROMETHAMINE 30 MG/ML IJ SOLN
30.0000 mg | Freq: Once | INTRAMUSCULAR | Status: AC
  Administered 2018-02-04: 30 mg via INTRAMUSCULAR
  Filled 2018-02-04: qty 1

## 2018-02-04 MED ORDER — DIPHENHYDRAMINE HCL 25 MG PO CAPS
25.0000 mg | ORAL_CAPSULE | Freq: Once | ORAL | Status: AC
  Administered 2018-02-04: 25 mg via ORAL
  Filled 2018-02-04: qty 1

## 2018-02-04 MED ORDER — METOCLOPRAMIDE HCL 10 MG PO TABS
5.0000 mg | ORAL_TABLET | Freq: Once | ORAL | Status: AC
  Administered 2018-02-04: 5 mg via ORAL
  Filled 2018-02-04: qty 1

## 2018-02-04 NOTE — Discharge Instructions (Addendum)
Please read attached information. If you experience any new or worsening signs or symptoms please return to the emergency room for evaluation. Please follow-up with your primary care provider or specialist as discussed. Please use medication prescribed only as directed and discontinue taking if you have any concerning signs or symptoms.   °
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
## 2018-02-04 NOTE — ED Notes (Signed)
Finger splint applied to pt's rt pointer finger.

## 2018-02-04 NOTE — ED Notes (Signed)
ED Provider at bedside. 

## 2018-02-04 NOTE — ED Notes (Signed)
See EDP assessment 
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
## 2020-11-13 DIAGNOSIS — Z79891 Long term (current) use of opiate analgesic: Secondary | ICD-10-CM | POA: Diagnosis not present
